# Patient Record
Sex: Male | Born: 2005 | Race: White | Hispanic: No | Marital: Single | Smoking: Never smoker
Health system: Southern US, Community
[De-identification: ages and names within clinical notes are randomized; demographics above are authoritative.]

---
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
## 2024-04-17 ENCOUNTER — Emergency Department (HOSPITAL_COMMUNITY)
Admission: EM | Admit: 2024-04-17 | Discharge: 2024-04-17 | Disposition: A | Discharge status: Home / Self Care | Attending: Emergency Medicine | Admitting: Emergency Medicine

## 2024-04-17 ENCOUNTER — Other Ambulatory Visit: Payer: Self-pay

## 2024-04-17 ENCOUNTER — Encounter (HOSPITAL_COMMUNITY): Payer: Self-pay | Admitting: *Deleted

## 2024-04-17 DIAGNOSIS — Z23 Encounter for immunization: Secondary | ICD-10-CM | POA: Insufficient documentation

## 2024-04-17 DIAGNOSIS — S71112A Laceration without foreign body, left thigh, initial encounter: Secondary | ICD-10-CM | POA: Diagnosis present

## 2024-04-17 DIAGNOSIS — S81812A Laceration without foreign body, left lower leg, initial encounter: Secondary | ICD-10-CM

## 2024-04-17 DIAGNOSIS — W293XXA Contact with powered garden and outdoor hand tools and machinery, initial encounter: Secondary | ICD-10-CM | POA: Insufficient documentation

## 2024-04-17 MED ORDER — LIDOCAINE-EPINEPHRINE-TETRACAINE (LET) TOPICAL GEL
3.0000 mL | Freq: Once | TOPICAL | Status: DC
  Filled 2024-04-17: qty 3

## 2024-04-17 MED ORDER — DOUBLE ANTIBIOTIC 500-10000 UNIT/GM EX OINT
TOPICAL_OINTMENT | Freq: Once | CUTANEOUS | Status: AC
  Filled 2024-04-17: qty 1

## 2024-04-17 MED ORDER — CEPHALEXIN 500 MG PO CAPS: 500.0000 mg | ORAL_CAPSULE | Freq: Three times a day (TID) | ORAL | 0 refills | Status: AC

## 2024-04-17 MED ORDER — CEPHALEXIN 500 MG PO CAPS
500.0000 mg | ORAL_CAPSULE | Freq: Once | ORAL | Status: AC
  Administered 2024-04-17: 500 mg via ORAL
  Filled 2024-04-17: qty 1

## 2024-04-17 MED ORDER — LIDOCAINE-EPINEPHRINE (PF) 2 %-1:200000 IJ SOLN
20.0000 mL | Freq: Once | INTRAMUSCULAR | Status: AC
Start: 2024-04-17 — End: 2024-04-17
  Administered 2024-04-17: 20 mL
  Filled 2024-04-17: qty 20

## 2024-04-17 MED ORDER — TETANUS-DIPHTH-ACELL PERTUSSIS 5-2-15.5 LF-MCG/0.5 IM SUSP
0.5000 mL | Freq: Once | INTRAMUSCULAR | Status: AC
  Administered 2024-04-17: 0.5 mL via INTRAMUSCULAR
  Filled 2024-04-17: qty 0.5

## 2024-04-17 MED ORDER — BACITRACIN ZINC 500 UNIT/GM EX OINT: 1.0000 | TOPICAL_OINTMENT | Freq: Two times a day (BID) | CUTANEOUS | 0 refills | Status: AC

## 2024-04-17 NOTE — ED Provider Notes (Signed)
 [place] EMERGENCY DEPARTMENT AT [facility] Provider Note   CSN: [id number] Arrival date & time: 04/17/24  1525     Patient presents with: Laceration   Raymond Diaz is a 18 y.o. male.   Patient is an 18 year old male who presents emergency department the chief complaint of a laceration to the anterior aspect of his left thigh.  Patient notes that he excellently cut it with a chainsaw just prior to arrival.  He is unsure of his last tetanus shot.  He denies any other secondary sites of injury or pain.  He has no known history of bleeding disorders or current anticoagulation use.   Laceration      Prior to Admission medications   Not on File    Allergies: Patient has no known allergies.    Review of Systems  Skin:        Laceration to the anterior aspect of the left thigh  All other systems reviewed and are negative.   Updated Vital Signs BP 113/62 (BP Location: Right Arm)   Pulse 70   Temp 98.6 F (37 C)   Resp 16   Ht 5' 10 (1.778 m)   Wt 77.1 kg   SpO2 100%   BMI 24.39 kg/m   Physical Exam Vitals and nursing note reviewed.  Constitutional:      Appearance: Normal appearance.  HENT:     Head: Normocephalic and atraumatic.  Cardiovascular:     Rate and Rhythm: Normal rate and regular rhythm.     Pulses: Normal pulses.  Pulmonary:     Effort: Pulmonary effort is normal. No respiratory distress.  Musculoskeletal:        General: No swelling, tenderness or deformity. Normal range of motion.     Cervical back: Normal range of motion and neck supple. No rigidity or tenderness.  Skin:    General: Skin is warm and dry.     Comments: 4 cm laceration noted to the anterior aspect of the left thigh, no obvious foreign bodies noted within the wound and wound evaluated to depth with adequate lighting, no underlying ligamentous, tendon, muscle involvement  Neurological:     General: No focal deficit present.     Mental Status: He is alert and oriented to  person, place, and time. Mental status is at baseline.  Psychiatric:        Mood and Affect: Mood normal.        Behavior: Behavior normal.        Thought Content: Thought content normal.        Judgment: Judgment normal.     (all labs ordered are listed, but only abnormal results are displayed) Labs Reviewed - No data to display  EKG: None  Radiology: No results found.   .Laceration Repair  Date/Time: 04/17/2024 6:06 PM  Performed by: Daralene Lonni BIRCH, PA-C Authorized by: Daralene Lonni BIRCH, PA-C   Consent:    Consent obtained:  Verbal   Consent given by:  Patient   Risks discussed:  Infection, pain, retained foreign body and need for additional repair   Alternatives discussed:  No treatment, delayed treatment and observation Universal protocol:    Procedure explained and questions answered to patient or proxy's satisfaction: yes     Immediately prior to procedure, a time out was called: yes     Patient identity confirmed:  Verbally with patient, arm band, provided demographic data and hospital-assigned identification number Anesthesia:    Anesthesia method:  Local infiltration  Local anesthetic:  Lidocaine 2% WITH epi Laceration details:    Location:  Leg   Leg location:  L upper leg   Length (cm):  4 Pre-procedure details:    Preparation:  Patient was prepped and draped in usual sterile fashion and imaging obtained to evaluate for foreign bodies Exploration:    Hemostasis achieved with:  Epinephrine   Wound exploration: wound explored through full range of motion and entire depth of wound visualized     Wound extent: fascia not violated, no foreign body, no signs of injury, no nerve damage, no tendon damage, no underlying fracture and no vascular damage     Contaminated: no   Treatment:    Area cleansed with:  Povidone-iodine and saline   Amount of cleaning:  Extensive   Irrigation solution:  Sterile saline   Debridement:  None   Undermining:  None    Layers/structures repaired:  Deep subcutaneous Deep subcutaneous:    Suture size:  4-0   Suture material:  Vicryl   Suture technique:  Simple interrupted   Number of sutures:  5 Skin repair:    Repair method:  Sutures   Suture size:  3-0   Suture material:  Prolene   Suture technique:  Simple interrupted   Number of sutures:  9 Approximation:    Approximation:  Close Repair type:    Repair type:  Complex Post-procedure details:    Dressing:  Antibiotic ointment and non-adherent dressing   Procedure completion:  Tolerated well, no immediate complications    Medications Ordered in the ED  Tdap (ADACEL) injection 0.5 mL (has no administration in time range)  lidocaine-EPINEPHrine-tetracaine (LET) topical gel (has no administration in time range)                                    Medical Decision Making Patient is doing well at this time and is stable for discharge home.  Laceration to the left thigh was repaired at the bedside and patient tolerated procedure well.  Patient had no signs of any foreign bodies within the wound and wound was evaluated to depth with adequate lighting.  There was no involvement of deep structures to include ligamentous, muscles or tendons.  There was no involvement of the vascular structures.  Area was thoroughly cleansed prior to procedure.  Will place on a short course of antibiotics as well.  Patient was neurovascularly intact distally and had full range of motion of the left lower extremity throughout.  Continue good wound care on outpatient basis was discussed as well as strict turn precautions for any new or worsening symptoms.  He was directed to return in 10 days for suture removal.  Patient voiced understanding to the plan and had no additional questions.  Risk OTC drugs. Prescription drug management.        Final diagnoses:  None    ED Discharge Orders     None          Daralene Lonni JONETTA DEVONNA 04/17/24 GUILLERMO Cleotilde Rogue, MD 04/17/24 [phone]

## 2024-04-17 NOTE — Discharge Instructions (Addendum)
 Please return to the emergency department or follow-up with primary care doctor in 10 days for suture removal.  Take all antibiotics as directed.  Return to emergency department immediately for any new or worsening symptoms or if any signs of infection are noted.  [facility] Primary Care Doctor List    Rollene Pesa, MD. Specialty: [facility] Medicine Contact information: [address]  [place] [address]  [phone]   Glendia Fielding, MD. Specialty: [facility] Medicine Contact information: [address]  [place] [address]  [phone]   Benita Outhouse, MD Specialty: Internal Medicine Contact information: [address] [place] [address]  [phone]   Darlyn Hurst, MD. Specialty: Internal Medicine Contact information: [address]  [place] [address]  [phone]    [facility] (Dr. Luke) Specialty: Family Medicine Contact information: [address]  [place] [address]  [phone]   Garnette Lolling, MD. Specialty: [facility] Medicine Contact information: [address]  [address]  [place] [address]  [phone]   Gaither Langton, MD. Specialty: Internal Medicine Contact information: [address]  [address]  [place] [address]  [phone]   [facility] Family Medicine: [address] [phone]  [place], Family medicine [address]  [phone]  [facility] [address] [place], [place] [phone]  [facility]: [address] [phone]    [facility]  [address] [place], [place] [address] [phone]  Services [facility] offers a variety of basic health services.  Services include but are not limited to: Blood pressure checks  Heart rate checks  Blood sugar checks  Urine analysis  Rapid strep tests  Pregnancy tests.  Health education and referrals  People needing  more complex services will be directed to a physician online. Using these virtual visits, doctors can evaluate and prescribe medicine and treatments. There will be no medication on-site, though [facility] will help patients fill their prescriptions at little to no cost.   For More information please go to: [email or web address]  Allergy and Asthma:    [address] [place] [phone]  Urology:  [address]  [place] [phone]  [facility]  [address] [place], [place] [phone]  Orthopedics   [address] [place], [place] [phone]  Endocrinology  [address] [place], [place] [phone]  Podiatry: [facility] [phone]

## 2024-04-17 NOTE — ED Triage Notes (Signed)
 Pt with lac to left thigh after accidentally cutting with a chainsaw. Believes last tetanus shot about 6 years ago.

## 2024-05-03 ENCOUNTER — Ambulatory Visit: Payer: Self-pay
